# Patient Record
Sex: Female | Born: 1945 | Race: Black or African American | Hispanic: No | Smoking: Never smoker
Health system: Southern US, Community
[De-identification: ages and names within clinical notes are randomized; demographics above are authoritative.]

## PROBLEM LIST (undated history)

## (undated) DIAGNOSIS — M549 Dorsalgia, unspecified: Secondary | ICD-10-CM

## (undated) DIAGNOSIS — E119 Type 2 diabetes mellitus without complications: Secondary | ICD-10-CM

## (undated) DIAGNOSIS — I10 Essential (primary) hypertension: Secondary | ICD-10-CM

## (undated) HISTORY — PX: CHOLECYSTECTOMY: SHX55

---

## 2009-01-11 ENCOUNTER — Ambulatory Visit: Payer: Self-pay | Admitting: Interventional Radiology

## 2009-01-11 ENCOUNTER — Emergency Department (HOSPITAL_BASED_OUTPATIENT_CLINIC_OR_DEPARTMENT_OTHER): Admission: EM | Admit: 2009-01-11 | Discharge: 2009-01-11 | Payer: Self-pay | Admitting: Emergency Medicine

## 2015-02-03 ENCOUNTER — Emergency Department (HOSPITAL_BASED_OUTPATIENT_CLINIC_OR_DEPARTMENT_OTHER)
Admission: EM | Admit: 2015-02-03 | Discharge: 2015-02-03 | Disposition: A | Discharge status: Home / Self Care | Payer: Medicare HMO | Attending: Emergency Medicine | Admitting: Emergency Medicine

## 2015-02-03 ENCOUNTER — Encounter (HOSPITAL_BASED_OUTPATIENT_CLINIC_OR_DEPARTMENT_OTHER): Payer: Self-pay | Admitting: *Deleted

## 2015-02-03 ENCOUNTER — Emergency Department (HOSPITAL_BASED_OUTPATIENT_CLINIC_OR_DEPARTMENT_OTHER): Payer: Medicare HMO

## 2015-02-03 DIAGNOSIS — M79601 Pain in right arm: Secondary | ICD-10-CM | POA: Diagnosis present

## 2015-02-03 DIAGNOSIS — E119 Type 2 diabetes mellitus without complications: Secondary | ICD-10-CM | POA: Insufficient documentation

## 2015-02-03 DIAGNOSIS — I10 Essential (primary) hypertension: Secondary | ICD-10-CM | POA: Insufficient documentation

## 2015-02-03 DIAGNOSIS — R52 Pain, unspecified: Secondary | ICD-10-CM

## 2015-02-03 DIAGNOSIS — M19041 Primary osteoarthritis, right hand: Secondary | ICD-10-CM | POA: Diagnosis not present

## 2015-02-03 HISTORY — DX: Type 2 diabetes mellitus without complications: E11.9

## 2015-02-03 HISTORY — DX: Essential (primary) hypertension: I10

## 2015-02-03 MED ORDER — KETOROLAC TROMETHAMINE 60 MG/2ML IM SOLN
30.0000 mg | Freq: Once | INTRAMUSCULAR | Status: AC
  Administered 2015-02-03: 30 mg via INTRAMUSCULAR
  Filled 2015-02-03: qty 2

## 2015-02-03 MED ORDER — HYDROCODONE-ACETAMINOPHEN 5-325 MG PO TABS: 2.0000 | ORAL_TABLET | ORAL | Status: AC | PRN

## 2015-02-03 NOTE — ED Notes (Signed)
PA at bedside discussing results with patient 

## 2015-02-03 NOTE — ED Provider Notes (Signed)
CSN: [id number]     Arrival date & time 02/03/15  1206 History   First MD Initiated Contact with Patient 02/03/15 1217     Chief Complaint  Patient presents with  . Arm Pain     (Consider location/radiation/quality/duration/timing/severity/associated sxs/prior Treatment) Patient is a 69 y.o. female presenting with arm pain. The history is provided by the patient. No language interpreter was used.  Arm Pain Associated symptoms include arthralgias. Pertinent negatives include no chills, fever or neck pain.   Ms. Kaitlin Lawrence is a 69 year old female with a history of arthritis, diabetes, hypertension, hyperlipidemia who presents for bilateral arm pain for the past couple of months but worse in the last 2 weeks. He states she has arthritis in the upper extremities and hands. She states the third finger of the right hand is swollen more than normal. She states that her primary care physician did not give her pain medications for her ongoing arthritis. She has been taking some leftover Toradol for her pain which helps minimally. She states that today she could no longer take the pain and would like something to go home with. She has a follow-up appointment next month with her primary care physician and states at that time she will request something for pain. He denies any injury or fall. She denies any fever, chills, dizziness, syncope, chest pain, shortness of breath, abdominal pain, nausea, vomiting, dysuria,, lower extremity pain. She ambulates with a cane at baseline. He denies any kidney disease.  Past Medical History  Diagnosis Date  . Hypertension   . Diabetes mellitus without complication    Past Surgical History  Procedure Laterality Date  . Cesarean section    . Cholecystectomy     No family history on file. History  Substance Use Topics  . Smoking status: Never Smoker   . Smokeless tobacco: Not on file  . Alcohol Use: No   OB History    No data available     Review of Systems   Constitutional: Negative for fever and chills.  Musculoskeletal: Positive for arthralgias. Negative for back pain, gait problem and neck pain.      Allergies  Review of patient's allergies indicates no known allergies.  Home Medications   Prior to Admission medications   Medication Sig Start Date End Date Taking? Authorizing Provider  HYDROcodone-acetaminophen (NORCO/VICODIN) 5-325 MG per tablet Take 2 tablets by mouth every 4 (four) hours as needed. 02/03/15   Pailyn Bellevue Patel-Mills, PA-C   BP 151/85 mmHg  Pulse 78  Temp(Src) 98.5 F (36.9 C) (Oral)  Resp 18  Ht 5' (1.524 m)  Wt 204 lb (92.534 kg)  BMI 39.84 kg/m2  SpO2 100% Physical Exam  Constitutional: She is oriented to person, place, and time. She appears well-developed and well-nourished.  HENT:  Head: Normocephalic.  Eyes: Conjunctivae are normal.  Neck: Normal range of motion.  Cardiovascular: Normal rate, regular rhythm and normal heart sounds.   Pulmonary/Chest: Effort normal and breath sounds normal. No respiratory distress. She has no wheezes. She has no rales.  Abdominal: She exhibits no distension.  Musculoskeletal: Normal range of motion.  Upper extremities: Patient is able to abduct her arms above her head and abduct her arms. She has good radial pulses bilaterally. Good flexion and extension of bilateral fingers. Good grip strength. Good sensation.  Third finger of the right hand is mildly swollen at the PIP.  Neurological: She is alert and oriented to person, place, and time.  Skin: Skin is warm and dry.  ED Course  Procedures (including critical care time) Labs Review Labs Reviewed - No data to display  Imaging Review Dg Hand Complete Right  02/03/2015   CLINICAL DATA:  Right hand pain, especially of the third digit, for 1 month. No known injury.  EXAM: RIGHT HAND - COMPLETE 3+ VIEW  COMPARISON:  None.  FINDINGS: Fusiform soft tissue swelling about the middle finger PIP joint. Subtle erosive changes  also noted at this level, best visualized at the base of the middle phalanx in the lateral projection. There is unexpected marginal lucency around the second MCP joint which favors erosive changes.  Diffuse degenerative interphalangeal joint narrowing with productive change.  Generalized osteopenia.  No acute fracture dislocation.  IMPRESSION: 1. Suspect erosive arthropathy with erosive change is seen to the second MCP and third PIP joints. There is associated soft tissue swelling to the long finger. 2. Diffuse interphalangeal osteoarthritis.   Electronically Signed   By: Marnee Spring M.D.   On: 02/03/2015 13:27     EKG Interpretation None      MDM   Final diagnoses:  Osteoarthritis of right hand, unspecified osteoarthritis type  Patient presents for bilateral upper extremity pain in the hands and shoulders for a few months but worse in the last 2 weeks. She has a history of arthritis, hypertension, diabetes, hyperlipidemia. She has no other acute symptoms such as chest pain or shortness of breath. Patient ambulated to the room from triage without difficulty. She is comfortably reading a magazine at bedside. She is in no acute distress and her vitals are stable. The x-ray of her right hand shows erosive arthropathy of her second MCP and third PIP joints with associated soft tissue swelling to the long finger. This is consistent with her location of pain in the right hand. I think this is all related to her arthritis and osteoarthritis. I do not suspect gout since this has been ongoing for the last few weeks and is not tender to light touch. I have given her 8 hydrocodone for pain. She can follow-up with her PCP and agrees with the plan.    Catha Gosselin, PA-C 02/03/15 1345  Purvis Sheffield, MD 02/03/15 [phone]

## 2015-02-03 NOTE — Discharge Instructions (Signed)

## 2015-02-03 NOTE — ED Notes (Signed)
Pain in her arms for 2 weeks. Pain is worse in her right arm from her hand to her shoulder. Pain in her left hand only. She is wearing arthritis gloves she ordered off the internet and an elastic wrist brace on her right wrist.

## 2016-06-27 ENCOUNTER — Encounter (HOSPITAL_BASED_OUTPATIENT_CLINIC_OR_DEPARTMENT_OTHER): Payer: Self-pay | Admitting: Emergency Medicine

## 2016-06-27 ENCOUNTER — Emergency Department (HOSPITAL_BASED_OUTPATIENT_CLINIC_OR_DEPARTMENT_OTHER)
Admission: EM | Admit: 2016-06-27 | Discharge: 2016-06-27 | Disposition: A | Discharge status: Home / Self Care | Payer: Medicare HMO | Attending: Emergency Medicine | Admitting: Emergency Medicine

## 2016-06-27 ENCOUNTER — Emergency Department (HOSPITAL_BASED_OUTPATIENT_CLINIC_OR_DEPARTMENT_OTHER): Payer: Medicare HMO

## 2016-06-27 DIAGNOSIS — Z7982 Long term (current) use of aspirin: Secondary | ICD-10-CM | POA: Insufficient documentation

## 2016-06-27 DIAGNOSIS — M19011 Primary osteoarthritis, right shoulder: Secondary | ICD-10-CM | POA: Insufficient documentation

## 2016-06-27 DIAGNOSIS — I10 Essential (primary) hypertension: Secondary | ICD-10-CM | POA: Insufficient documentation

## 2016-06-27 DIAGNOSIS — E119 Type 2 diabetes mellitus without complications: Secondary | ICD-10-CM | POA: Insufficient documentation

## 2016-06-27 DIAGNOSIS — Z7984 Long term (current) use of oral hypoglycemic drugs: Secondary | ICD-10-CM | POA: Diagnosis not present

## 2016-06-27 DIAGNOSIS — M62838 Other muscle spasm: Secondary | ICD-10-CM

## 2016-06-27 DIAGNOSIS — Z79899 Other long term (current) drug therapy: Secondary | ICD-10-CM | POA: Diagnosis not present

## 2016-06-27 DIAGNOSIS — M549 Dorsalgia, unspecified: Secondary | ICD-10-CM | POA: Insufficient documentation

## 2016-06-27 DIAGNOSIS — M25511 Pain in right shoulder: Secondary | ICD-10-CM | POA: Diagnosis present

## 2016-06-27 HISTORY — DX: Dorsalgia, unspecified: M54.9

## 2016-06-27 MED ORDER — CYCLOBENZAPRINE HCL 10 MG PO TABS: 10.0000 mg | ORAL_TABLET | Freq: Two times a day (BID) | ORAL | 0 refills | Status: AC | PRN

## 2016-06-27 MED ORDER — ACETAMINOPHEN 325 MG PO TABS
650.0000 mg | ORAL_TABLET | Freq: Once | ORAL | Status: AC
  Administered 2016-06-27: 650 mg via ORAL
  Filled 2016-06-27: qty 2

## 2016-06-27 NOTE — ED Triage Notes (Signed)
Patient reports that she started to have sharp pains to her right shoulder down her back 2 hours ago. It hurts to take a seep breath

## 2016-06-27 NOTE — ED Provider Notes (Signed)
[facility]   CSN: [id number] Arrival date & time: 06/27/16  1717 By signing my name below, I, [name], attest that this documentation has been prepared under the direction and in the presence of Gwyneth SproutWhitney Juliahna Wiswell, MD. Electronically Signed: [name], ED Scribe. 06/27/16. 6:28 PM.  History   Chief Complaint Chief Complaint  Patient presents with  . Shoulder Pain   HPI Comments: Frankey ShownMary Lawrence is a 70 y.o. female with h/o HTN and DM who presents to the Emergency Department complaining of intermittent, sharp, right shoulder pain radiating to her back onset one day ago. Pt denies any recent injury or trauma. Pt states pain is exacerbated with deep breathing and movement. She has taken Tylenol with mild relief. Pt is ambulatory with a cane; she notes that she has pain going down her right arm but states that it is baseline. She denies any h/o similar symptoms. Pt further denies fever, shortness of breath, cough, abdominal pain, or any other associated symptoms.   The history is provided by the patient. No language interpreter was used.    Past Medical History:  Diagnosis Date  . Back pain   . Diabetes mellitus without complication (HCC)   . Hypertension     There are no active problems to display for this patient.   Past Surgical History:  Procedure Laterality Date  . CESAREAN SECTION    . CHOLECYSTECTOMY      OB History    No data available       Home Medications    Prior to Admission medications   Medication Sig Start Date End Date Taking? Authorizing Provider  amLODipine (NORVASC) 5 MG tablet Take 5 mg by mouth daily.   Yes Historical Provider, MD  aspirin EC 81 MG tablet Take 81 mg by mouth daily.   Yes Historical Provider, MD  ferrous sulfate 325 (65 FE) MG EC tablet Take 325 mg by mouth 3 (three) times daily with meals.   Yes Historical Provider, MD  gabapentin (NEURONTIN) 300 MG capsule Take 300 mg by mouth 3 (three) times daily.   Yes  Historical Provider, MD  glipiZIDE (GLUCOTROL XL) 10 MG 24 hr tablet Take 10 mg by mouth daily with breakfast.   Yes Historical Provider, MD  losartan (COZAAR) 50 MG tablet Take 50 mg by mouth daily.   Yes Historical Provider, MD  memantine (NAMENDA) 10 MG tablet Take 10 mg by mouth 2 (two) times daily.   Yes Historical Provider, MD  metFORMIN (GLUCOPHAGE) 500 MG tablet Take by mouth 2 (two) times daily with a meal.   Yes Historical Provider, MD  metoprolol (LOPRESSOR) 50 MG tablet Take 50 mg by mouth 2 (two) times daily.   Yes Historical Provider, MD  omeprazole (PRILOSEC) 20 MG capsule Take 20 mg by mouth daily.   Yes Historical Provider, MD  pravastatin (PRAVACHOL) 40 MG tablet Take 40 mg by mouth daily.   Yes Historical Provider, MD  sertraline (ZOLOFT) 100 MG tablet Take 100 mg by mouth daily.   Yes Historical Provider, MD  HYDROcodone-acetaminophen (NORCO/VICODIN) 5-325 MG per tablet Take 2 tablets by mouth every 4 (four) hours as needed. 02/03/15   Catha GosselinHanna Patel-Mills, PA-C    Family History History reviewed. No pertinent family history.  Social History Social History  Substance Use Topics  . Smoking status: Never Smoker  . Smokeless tobacco: Never Used  . Alcohol use No     Allergies   Codeine   Review of Systems Review  of Systems  Constitutional: Negative for fever.  Respiratory: Negative for cough and shortness of breath.   Musculoskeletal: Positive for arthralgias and back pain.  All other systems reviewed and are negative.    Physical Exam Updated Vital Signs BP 148/69 (BP Location: Right Arm)   Pulse 80   Temp 99.9 F (37.7 C) (Oral)   Resp 18   Ht 5' (1.524 m)   Wt 204 lb (92.5 kg)   SpO2 100%   BMI 39.84 kg/m   Physical Exam  Constitutional: She is oriented to person, place, and time. She appears well-developed and well-nourished.  HENT:  Head: Normocephalic and atraumatic.  Eyes: EOM are normal. Pupils are equal, round, and reactive to light.  Neck:  Normal range of motion. Neck supple. No JVD present.  Cardiovascular: Normal rate, regular rhythm and normal heart sounds.   No murmur heard. Pulses:      Radial pulses are 2+ on the right side.  Pulmonary/Chest: Effort normal and breath sounds normal. She has no wheezes. She has no rales. She exhibits no tenderness.  Abdominal: Soft. Bowel sounds are normal. She exhibits no distension and no mass. There is no tenderness.  Musculoskeletal: Normal range of motion. She exhibits tenderness. She exhibits no edema.  2+ radial pulse in the right arm. 5/5 strength in the hands, forearms, and biceps. Significant pain with palpation over the right AC joint of the shoulder. Pain and spasm noted of the right trapezius. Pain with ROM of the right shoulder. No neck pain.  Lymphadenopathy:    She has no cervical adenopathy.  Neurological: She is alert and oriented to person, place, and time. No cranial nerve deficit. She exhibits normal muscle tone. Coordination normal.  Skin: Skin is warm and dry. No rash noted.  Psychiatric: She has a normal mood and affect. Her behavior is normal. Judgment and thought content normal.  Nursing note and vitals reviewed.    ED Treatments / Results  DIAGNOSTIC STUDIES: Oxygen Saturation is 100% on RA, normal by my interpretation.    COORDINATION OF CARE: 6:23 PM Discussed treatment plan with pt at bedside which includes x-ray and pt agreed to plan.  Labs (all labs ordered are listed, but only abnormal results are displayed) Labs Reviewed - No data to display  EKG  EKG Interpretation  Date/Time:  Monday June 27 2016 17:30:00 EDT Ventricular Rate:  72 PR Interval:  146 QRS Duration: 112 QT Interval:  430 QTC Calculation: 470 R Axis:   69 Text Interpretation:  Normal sinus rhythm Low voltage QRS Incomplete right bundle branch block No previous tracing Confirmed by Anitra LauthPLUNKETT  MD, Alphonzo LemmingsWHITNEY ([id number]) on 06/27/2016 5:36:06 PM       Radiology No results  found.  Procedures Procedures (including critical care time)  Medications Ordered in ED Medications - No data to display   Initial Impression / Assessment and Plan / ED Course  I have reviewed the triage vital signs and the nursing notes.  Pertinent labs & imaging results that were available during my care of the patient were reviewed by me and considered in my medical decision making (see chart for details).  Clinical Course   Patient is a 70 year old female presenting today with 2 days of pain to the right upper chest radiating behind the right shoulder. Pain is worse with movement of the arm and deep breathing. Patient has baseline pain in the right shoulder constantly that radiates down her arm today is the first time she's had in the  back of her arm. She denies any shortness of breath or cardiac type symptoms.  No cardiac history. EKG without prior to compare showing an incomplete right bundle branch block but no other significant findings. Vital signs within normal limits. Patient in no acute distress on exam but pain is clearly reproduced with palpation and movement of the right shoulder. Right shoulder films show generalized osteoarthritic changes. Patient does have an orthopedist who she will follow-up with. Visualized right lung is clear on imaging.  Final Clinical Impressions(s) / ED Diagnoses   Final diagnoses:  Osteoarthritis of right shoulder, unspecified osteoarthritis type  Muscle spasm    New Prescriptions Discharge Medication List as of 06/27/2016  7:27 PM    START taking these medications   Details  cyclobenzaprine (FLEXERIL) 10 MG tablet Take 1 tablet (10 mg total) by mouth 2 (two) times daily as needed for muscle spasms., Starting Mon 06/27/2016, Print       I personally performed the services described in this documentation, which was scribed in my presence.  The recorded information has been reviewed and considered.     [name], MD 06/27/16  1946

## 2016-09-12 ENCOUNTER — Encounter (HOSPITAL_BASED_OUTPATIENT_CLINIC_OR_DEPARTMENT_OTHER): Payer: Self-pay | Admitting: *Deleted

## 2016-09-12 ENCOUNTER — Emergency Department (HOSPITAL_BASED_OUTPATIENT_CLINIC_OR_DEPARTMENT_OTHER)
Admission: EM | Admit: 2016-09-12 | Discharge: 2016-09-12 | Disposition: A | Discharge status: Home / Self Care | Payer: Medicare PPO | Attending: Emergency Medicine | Admitting: Emergency Medicine

## 2016-09-12 DIAGNOSIS — Z7982 Long term (current) use of aspirin: Secondary | ICD-10-CM | POA: Insufficient documentation

## 2016-09-12 DIAGNOSIS — Y999 Unspecified external cause status: Secondary | ICD-10-CM | POA: Insufficient documentation

## 2016-09-12 DIAGNOSIS — M545 Low back pain, unspecified: Secondary | ICD-10-CM

## 2016-09-12 DIAGNOSIS — Z79899 Other long term (current) drug therapy: Secondary | ICD-10-CM | POA: Insufficient documentation

## 2016-09-12 DIAGNOSIS — S39012A Strain of muscle, fascia and tendon of lower back, initial encounter: Secondary | ICD-10-CM | POA: Insufficient documentation

## 2016-09-12 DIAGNOSIS — Z7984 Long term (current) use of oral hypoglycemic drugs: Secondary | ICD-10-CM | POA: Diagnosis not present

## 2016-09-12 DIAGNOSIS — I10 Essential (primary) hypertension: Secondary | ICD-10-CM | POA: Diagnosis not present

## 2016-09-12 DIAGNOSIS — Y939 Activity, unspecified: Secondary | ICD-10-CM | POA: Insufficient documentation

## 2016-09-12 DIAGNOSIS — R739 Hyperglycemia, unspecified: Secondary | ICD-10-CM

## 2016-09-12 DIAGNOSIS — Y929 Unspecified place or not applicable: Secondary | ICD-10-CM | POA: Diagnosis not present

## 2016-09-12 DIAGNOSIS — T148XXA Other injury of unspecified body region, initial encounter: Secondary | ICD-10-CM

## 2016-09-12 DIAGNOSIS — X58XXXA Exposure to other specified factors, initial encounter: Secondary | ICD-10-CM | POA: Insufficient documentation

## 2016-09-12 DIAGNOSIS — E1165 Type 2 diabetes mellitus with hyperglycemia: Secondary | ICD-10-CM | POA: Insufficient documentation

## 2016-09-12 LAB — CBC WITH DIFFERENTIAL/PLATELET
BASOS ABS: 0 10*3/uL (ref 0.0–0.1)
BASOS PCT: 0 %
Eosinophils Absolute: 0.1 10*3/uL (ref 0.0–0.7)
Eosinophils Relative: 1 %
HEMATOCRIT: 36 % (ref 36.0–46.0)
HEMOGLOBIN: 11.4 g/dL — AB (ref 12.0–15.0)
Lymphocytes Relative: 24 %
Lymphs Abs: 1.6 10*3/uL (ref 0.7–4.0)
MCH: 27.9 pg (ref 26.0–34.0)
MCHC: 31.7 g/dL (ref 30.0–36.0)
MCV: 88.2 fL (ref 78.0–100.0)
Monocytes Absolute: 0.4 10*3/uL (ref 0.1–1.0)
Monocytes Relative: 6 %
NEUTROS ABS: 4.7 10*3/uL (ref 1.7–7.7)
NEUTROS PCT: 69 %
Platelets: 355 10*3/uL (ref 150–400)
RBC: 4.08 MIL/uL (ref 3.87–5.11)
RDW: 13.7 % (ref 11.5–15.5)
WBC: 6.8 10*3/uL (ref 4.0–10.5)

## 2016-09-12 LAB — BASIC METABOLIC PANEL
ANION GAP: 9 (ref 5–15)
BUN: 18 mg/dL (ref 6–20)
CALCIUM: 9.6 mg/dL (ref 8.9–10.3)
CHLORIDE: 100 mmol/L — AB (ref 101–111)
CO2: 28 mmol/L (ref 22–32)
Creatinine, Ser: 1.05 mg/dL — ABNORMAL HIGH (ref 0.44–1.00)
GFR calc non Af Amer: 52 mL/min — ABNORMAL LOW (ref >60)
GFR, EST AFRICAN AMERICAN: 60 mL/min — AB (ref >60)
Glucose, Bld: 389 mg/dL — ABNORMAL HIGH (ref 65–99)
Potassium: 3.2 mmol/L — ABNORMAL LOW (ref 3.5–5.1)
SODIUM: 137 mmol/L (ref 135–145)

## 2016-09-12 LAB — URINALYSIS, ROUTINE W REFLEX MICROSCOPIC
Bilirubin Urine: NEGATIVE
Glucose, UA: >=500 mg/dL — AB
Hgb urine dipstick: NEGATIVE
KETONES UR: NEGATIVE mg/dL
LEUKOCYTES UA: NEGATIVE
NITRITE: NEGATIVE
PROTEIN: NEGATIVE mg/dL
Specific Gravity, Urine: 1.024 (ref 1.005–1.030)
pH: 6 (ref 5.0–8.0)

## 2016-09-12 LAB — URINALYSIS, MICROSCOPIC (REFLEX)

## 2016-09-12 NOTE — ED Notes (Signed)
Pt states she has not taken her metformin today and will take it when she gets home. Pt does not want to stay for any further evaluation.

## 2016-09-12 NOTE — ED Provider Notes (Signed)
[facility]   CSN: [id number] Arrival date & time: 09/12/16  1643  By signing my name below, I, Kaitlin Lawrence, attest that this documentation has been prepared under the direction and in the presence of [name], MD.  Electronically Signed: [name], ED Scribe. 09/12/16. 7:34 PM.    History   Chief Complaint Chief Complaint  Patient presents with  . Abdominal Pain   The history is provided by the patient. No language interpreter was used.   HPI Comments: Kaitlin Lawrence is a 71 y.o. female who has a PMhx DM and HTN presents to the Emergency Department complaining of sudden onset, moderate, gradual worsening, right mid-lower back pain that began ~ 4 hours ago. She describes the pain as aching and states it is non-radiating. She reports rhinorrhea, cough, sneezing, fatigue, and gradual improving diarrhea. Pt says she had chest pain yesterday that has since resolved. Pt states she was walking up down a walkway when her flank pain started. No falls, weight lifting or exercising noted. She notes twisting her body increases her pain. Pt says that she has a frequent fall hx but notes she has not fallen in 3 months. She has not taken any medication to alleviate the pain. Pt says her pain is worse with palpation. She denies nausea, vomiting, dysuria, bowel incontinence, and fever.  Past Medical History:  Diagnosis Date  . Back pain   . Diabetes mellitus without complication (HCC)   . Hypertension     There are no active problems to display for this patient.   Past Surgical History:  Procedure Laterality Date  . CESAREAN SECTION    . CHOLECYSTECTOMY      OB History    No data available       Home Medications    Prior to Admission medications   Medication Sig Start Date End Date Taking? Authorizing Provider  amLODipine (NORVASC) 5 MG tablet Take 5 mg by mouth daily.    Historical Provider, MD  aspirin EC 81 MG tablet Take 81 mg by mouth  daily.    Historical Provider, MD  cyclobenzaprine (FLEXERIL) 10 MG tablet Take 1 tablet (10 mg total) by mouth 2 (two) times daily as needed for muscle spasms. 06/27/16   [name], MD  ferrous sulfate 325 (65 FE) MG EC tablet Take 325 mg by mouth 3 (three) times daily with meals.    Historical Provider, MD  gabapentin (NEURONTIN) 300 MG capsule Take 300 mg by mouth 3 (three) times daily.    Historical Provider, MD  glipiZIDE (GLUCOTROL XL) 10 MG 24 hr tablet Take 10 mg by mouth daily with breakfast.    Historical Provider, MD  HYDROcodone-acetaminophen (NORCO/VICODIN) 5-325 MG per tablet Take 2 tablets by mouth every 4 (four) hours as needed. 02/03/15   [name], PA-C  losartan (COZAAR) 50 MG tablet Take 50 mg by mouth daily.    Historical Provider, MD  memantine (NAMENDA) 10 MG tablet Take 10 mg by mouth 2 (two) times daily.    Historical Provider, MD  metFORMIN (GLUCOPHAGE) 500 MG tablet Take by mouth 2 (two) times daily with a meal.    Historical Provider, MD  metoprolol (LOPRESSOR) 50 MG tablet Take 50 mg by mouth 2 (two) times daily.    Historical Provider, MD  omeprazole (PRILOSEC) 20 MG capsule Take 20 mg by mouth daily.    Historical Provider, MD  pravastatin (PRAVACHOL) 40 MG tablet Take 40 mg by mouth daily.  Historical Provider, MD  sertraline (ZOLOFT) 100 MG tablet Take 100 mg by mouth daily.    Historical Provider, MD    Family History No family history on file.  Social History Social History  Substance Use Topics  . Smoking status: Never Smoker  . Smokeless tobacco: Never Used  . Alcohol use No     Allergies   Codeine   Review of Systems Review of Systems  A complete 10 system review of systems was obtained and all systems are negative except as noted in the HPI and PMH.   Physical Exam Updated Vital Signs Initial vitals: BP 134/65 (BP Location: Right Arm)   Pulse 100   Temp 98.5 F (36.9 C) (Oral)   Resp 20   Ht 5' (1.524 m)   Wt 188 lb  (85.3 kg)   SpO2 99%   BMI 36.72 kg/m   Physical Exam  Constitutional: She is oriented to person, place, and time. She appears well-developed and well-nourished. No distress.  HENT:  Head: Normocephalic and atraumatic.  Nose: Nose normal.  Eyes: Conjunctivae and EOM are normal. Pupils are equal, round, and reactive to light. Right eye exhibits no discharge. Left eye exhibits no discharge. No scleral icterus.  Neck: Normal range of motion. Neck supple.  Cardiovascular: Normal rate and regular rhythm.  Exam reveals no gallop and no friction rub.   No murmur heard. Pulmonary/Chest: Effort normal and breath sounds normal. No stridor. No respiratory distress. She has no rales.  Abdominal: Soft. She exhibits no distension. There is no tenderness.  Musculoskeletal: She exhibits tenderness. She exhibits no edema.  Tenderness to the right lower parathoracic region and right paralumbar region  Neurological: She is alert and oriented to person, place, and time.  Spine Exam:  Strength: 5/5 throughout LE bilaterally (hip flexion/extension, adduction/abduction; knee flexion/extension; foot dorsiflexion/plantarflexion, inversion/eversion; great toe inversion) Sensation: Intact to light touch in proximal and distal LE bilaterally Reflexes: 1+ quadriceps and achilles reflexes    Skin: Skin is warm and dry. No rash noted. She is not diaphoretic. No erythema.  Psychiatric: She has a normal mood and affect.  Vitals reviewed.    ED Treatments / Results  DIAGNOSTIC STUDIES: Oxygen Saturation is 99% on RA, normal by my interpretation.  COORDINATION OF CARE:  7:32 PM Discussed treatment plan with pt at bedside and pt agreed to plan.  Labs (all labs ordered are listed, but only abnormal results are displayed) Labs Reviewed  URINALYSIS, ROUTINE W REFLEX MICROSCOPIC - Abnormal; Notable for the following:       Result Value   Glucose, UA >=500 (*)    All other components within normal limits  CBC  WITH DIFFERENTIAL/PLATELET - Abnormal; Notable for the following:    Hemoglobin 11.4 (*)    All other components within normal limits  BASIC METABOLIC PANEL - Abnormal; Notable for the following:    Potassium 3.2 (*)    Chloride 100 (*)    Glucose, Bld 389 (*)    Creatinine, Ser 1.05 (*)    GFR calc non Af Amer 52 (*)    GFR calc Af Amer 60 (*)    All other components within normal limits  URINALYSIS, MICROSCOPIC (REFLEX) - Abnormal; Notable for the following:    Bacteria, UA RARE (*)    Squamous Epithelial / LPF 0-5 (*)    All other components within normal limits    EKG  EKG Interpretation None       Radiology No results found.  Procedures  Procedures (including critical care time)  Medications Ordered in ED Medications - No data to display   Initial Impression / Assessment and Plan / ED Course  I have reviewed the triage vital signs and the nursing notes.  Pertinent labs & imaging results that were available during my care of the patient were reviewed by me and considered in my medical decision making (see chart for details).  Clinical Course     71 y.o. female presents with back pain inthoracolumbar area since this afternoon without signs of radicular pain. No acute traumatic onset. No red flag symptoms of fever, weight loss, saddle anesthesia, weakness, fecal/urinary incontinence or urinary retention.   UA w/o infection - doubt pyelonephritis.  Suspect MSK etiology. No indication for imaging emergently. Patient was recommended to take short course of scheduled tylenol and engage in early mobility as definitive treatment. Return precautions discussed for worsening or new concerning symptoms.   Labs did reveal hyperglycemia w/o DKA. Pt reported not taking her Metformin today because, "she forgot." She declined treatment in the ED and states she will call her PCP in the morning if it's still elevated.  The patient is safe for discharge with strict return  precautions.   Final Clinical Impressions(s) / ED Diagnoses   Final diagnoses:  Muscle strain  Acute right-sided low back pain without sciatica  Hyperglycemia   Disposition: Discharge  Condition: Good  I have discussed the results, Dx and Tx plan with the patient who expressed understanding and agree(s) with the plan. Discharge instructions discussed at great length. The patient was given strict return precautions who verbalized understanding of the instructions. No further questions at time of discharge.    New Prescriptions   No medications on file    Follow Up: primary care provider  Schedule an appointment as soon as possible for a visit     I personally performed the services described in this documentation, which was scribed in my presence. The recorded information has been reviewed and is accurate.        [name], MD 09/12/16 2016

## 2016-09-12 NOTE — ED Triage Notes (Signed)
Left upper quad pain x 2 hours.

## 2016-09-12 NOTE — ED Notes (Signed)
ED Provider at bedside. 

## 2017-08-12 IMAGING — CR DG SHOULDER 2+V*R*
3 series · 3 of 3 positions shown · non-contrast
Comparison: June 11, 2015

CLINICAL DATA: Pain for 1 day

EXAM:
RIGHT SHOULDER - 2+ VIEW

[w shoulder grashey right *]
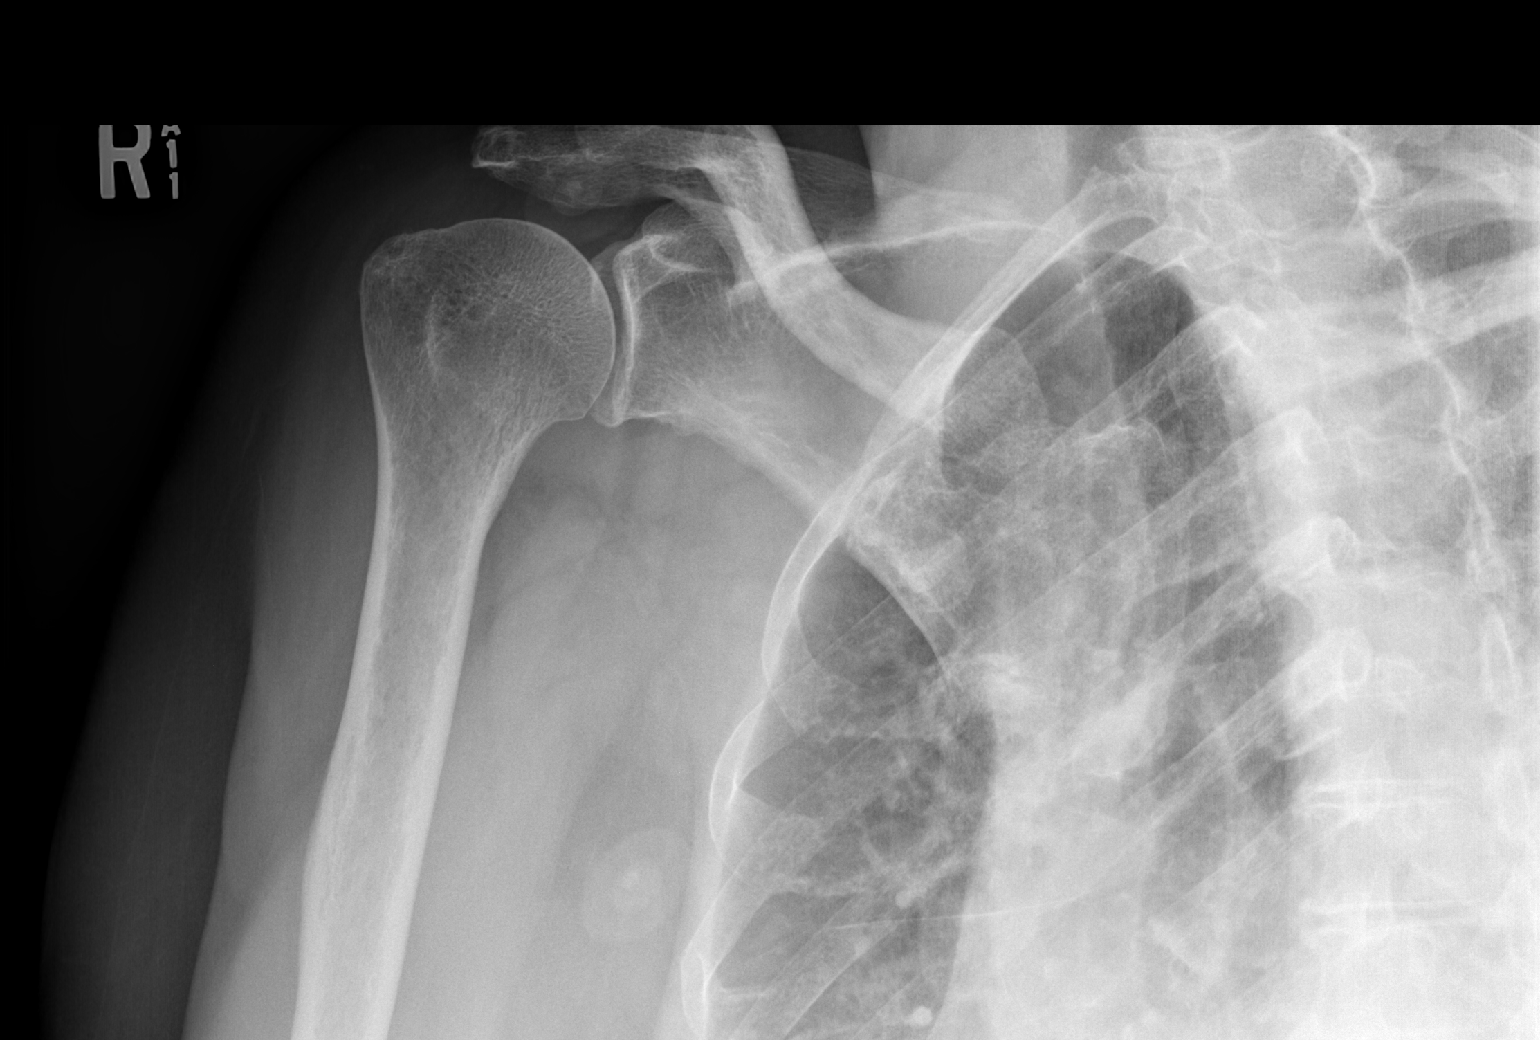

[w shoulder y view right *]
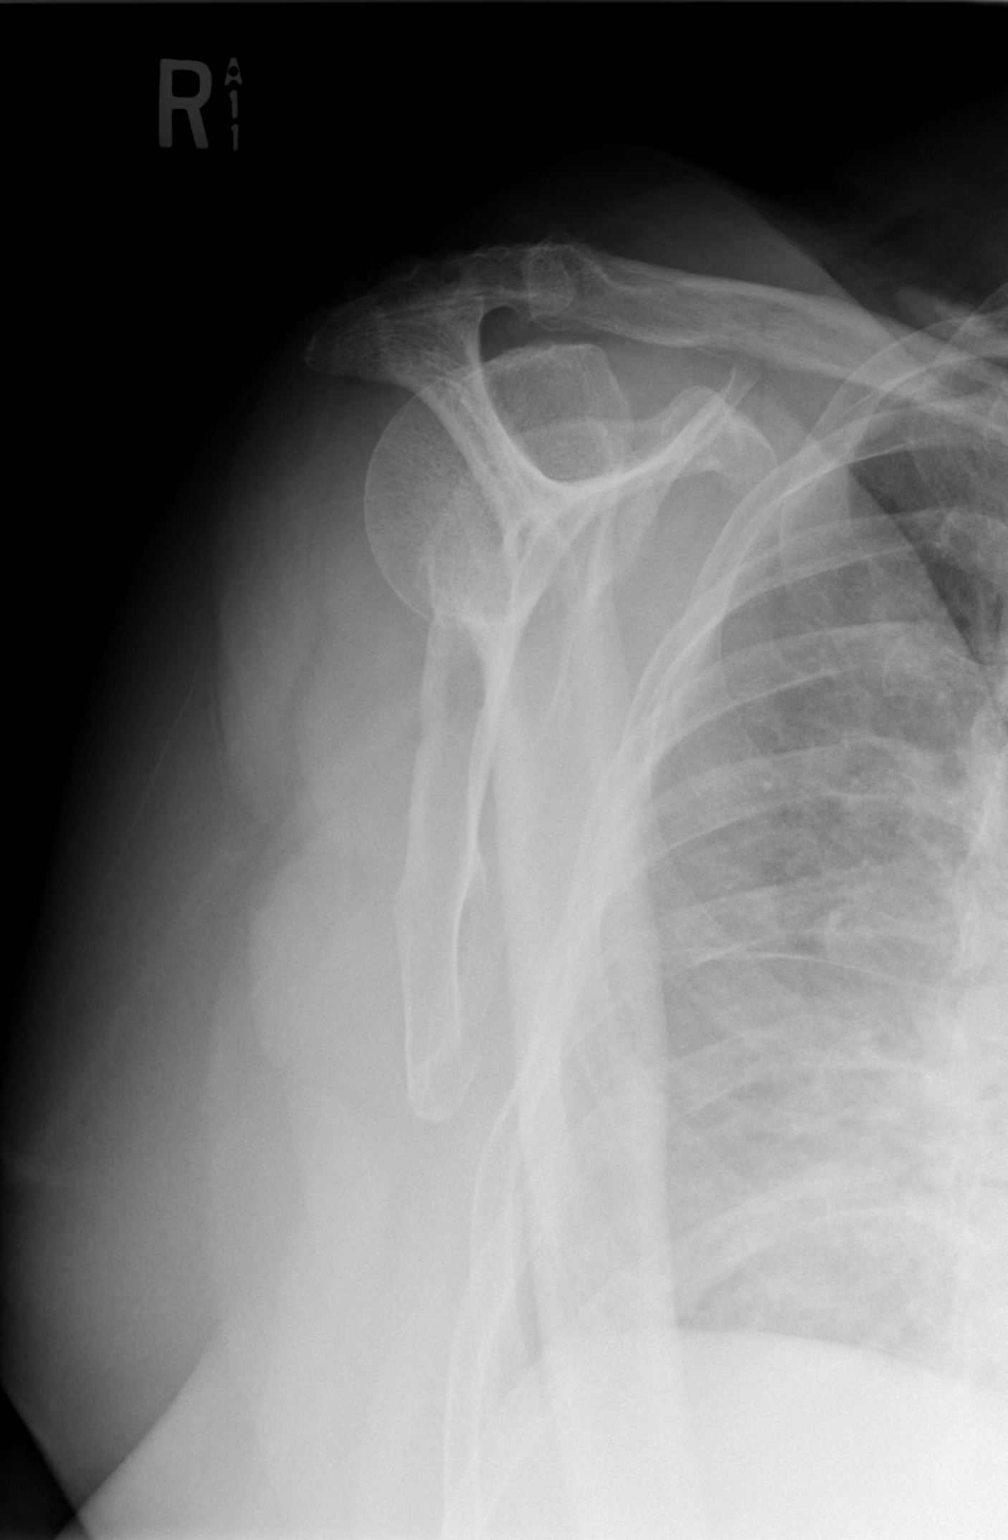

[x shoulder axillary right]
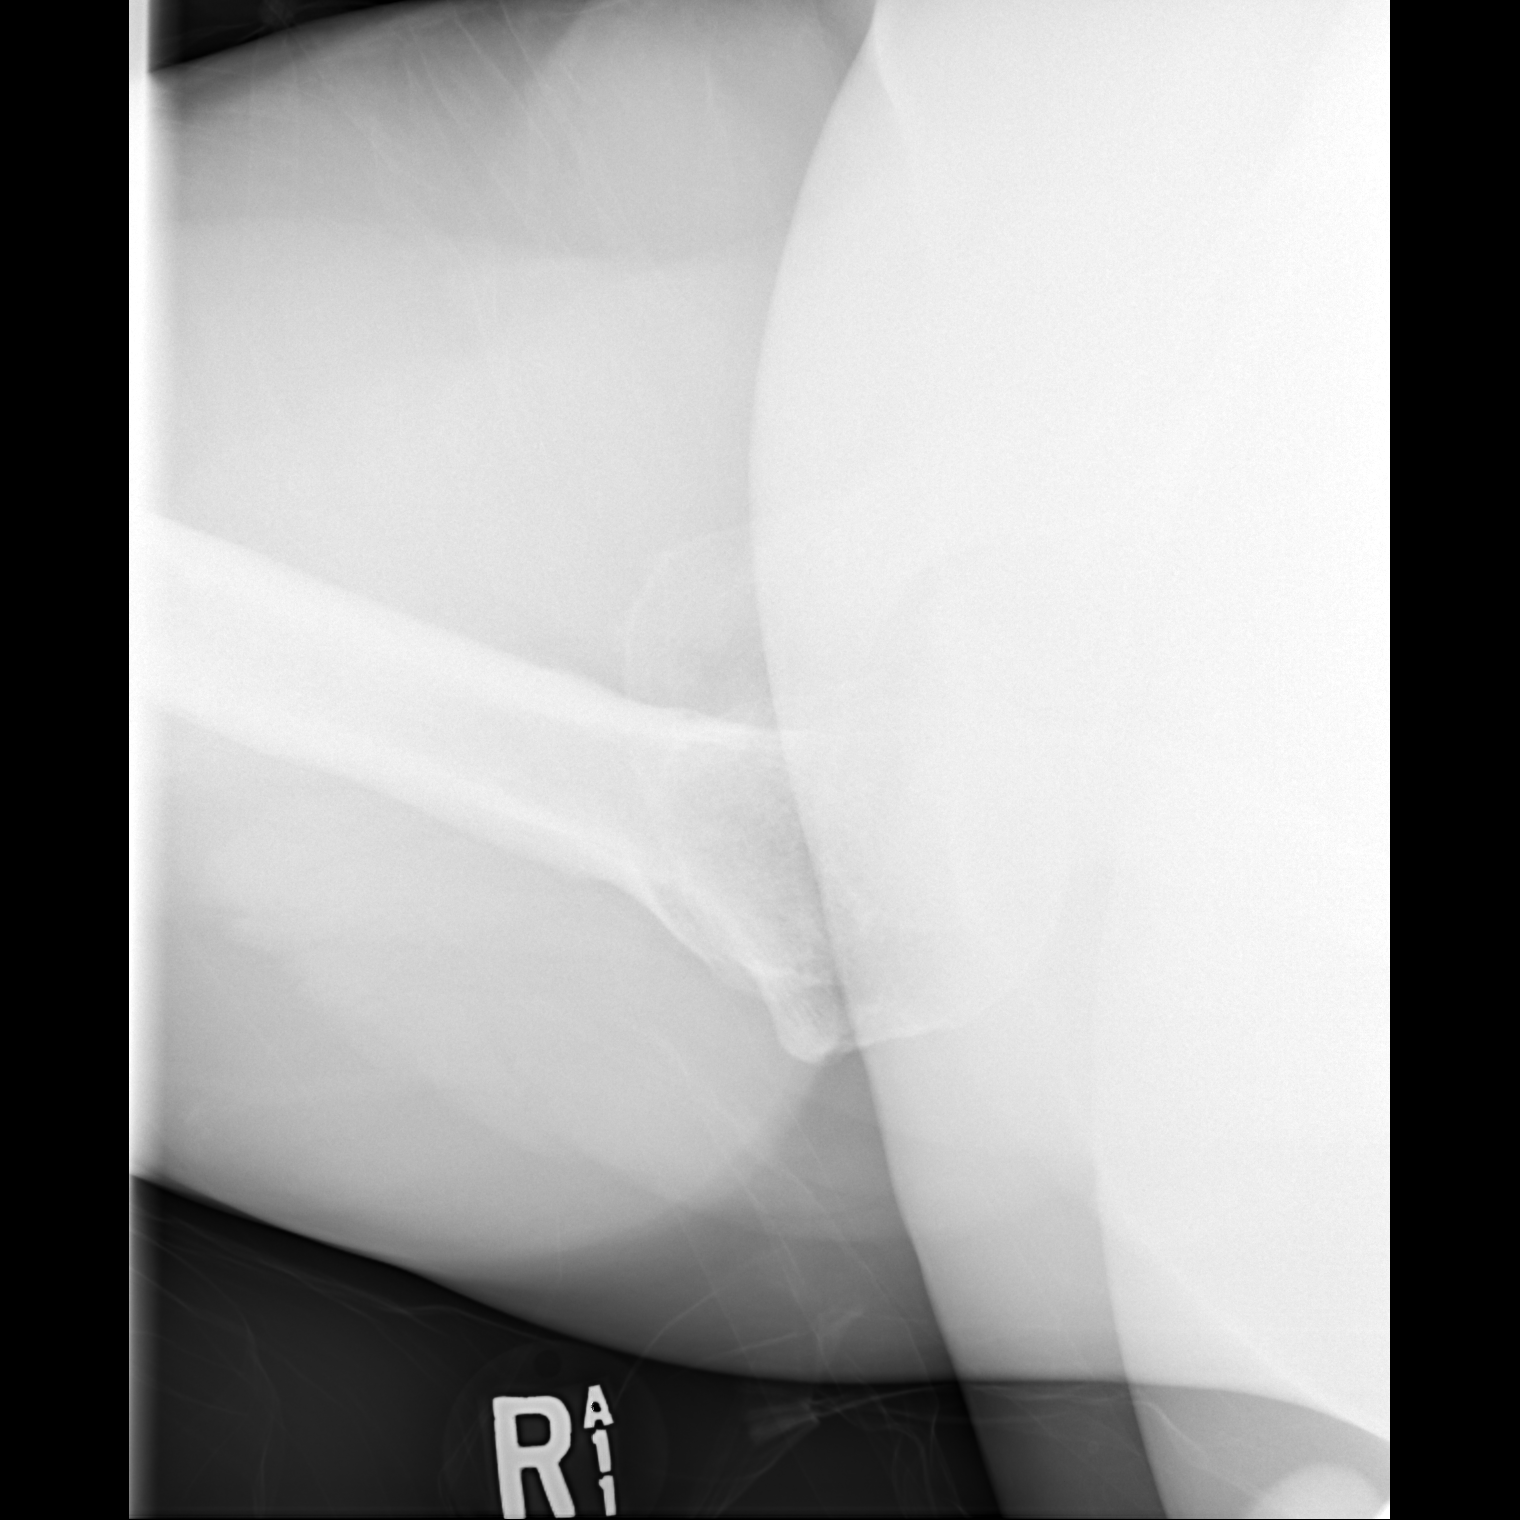

[3 of 3 positions shown; findings below may reference images not displayed]

FINDINGS: Frontal, Y scapular, and axillary images were obtained. There is
mild generalized osteoarthritic change. No fracture or dislocation.
No erosive change. Visualized right lung is clear.
IMPRESSION: Mild generalized osteoarthritic change.  No fracture or dislocation.

## 2019-08-30 DEATH — deceased
